# Patient Record
Sex: Female | Born: 1975 | Race: Black or African American | Hispanic: No | Marital: Single | Smoking: Current every day smoker
Health system: Southern US, Community
[De-identification: ages and names within clinical notes are randomized; demographics above are authoritative.]

## PROBLEM LIST (undated history)

## (undated) ENCOUNTER — Emergency Department (HOSPITAL_BASED_OUTPATIENT_CLINIC_OR_DEPARTMENT_OTHER): Admission: EM | Payer: 59 | Source: Home / Self Care

## (undated) DIAGNOSIS — O009 Unspecified ectopic pregnancy without intrauterine pregnancy: Secondary | ICD-10-CM

---

## 2014-02-26 ENCOUNTER — Emergency Department (HOSPITAL_BASED_OUTPATIENT_CLINIC_OR_DEPARTMENT_OTHER)
Admission: EM | Admit: 2014-02-26 | Discharge: 2014-02-26 | Disposition: A | Discharge status: Home / Self Care | Payer: Medicaid Other | Attending: Emergency Medicine | Admitting: Emergency Medicine

## 2014-02-26 ENCOUNTER — Emergency Department (HOSPITAL_BASED_OUTPATIENT_CLINIC_OR_DEPARTMENT_OTHER): Payer: Medicaid Other

## 2014-02-26 ENCOUNTER — Encounter (HOSPITAL_BASED_OUTPATIENT_CLINIC_OR_DEPARTMENT_OTHER): Payer: Self-pay | Admitting: Emergency Medicine

## 2014-02-26 DIAGNOSIS — S8992XA Unspecified injury of left lower leg, initial encounter: Secondary | ICD-10-CM | POA: Diagnosis present

## 2014-02-26 DIAGNOSIS — Y939 Activity, unspecified: Secondary | ICD-10-CM | POA: Insufficient documentation

## 2014-02-26 DIAGNOSIS — W1839XA Other fall on same level, initial encounter: Secondary | ICD-10-CM | POA: Insufficient documentation

## 2014-02-26 DIAGNOSIS — Z720 Tobacco use: Secondary | ICD-10-CM | POA: Insufficient documentation

## 2014-02-26 DIAGNOSIS — Y929 Unspecified place or not applicable: Secondary | ICD-10-CM | POA: Diagnosis not present

## 2014-02-26 DIAGNOSIS — S59902A Unspecified injury of left elbow, initial encounter: Secondary | ICD-10-CM | POA: Diagnosis not present

## 2014-02-26 DIAGNOSIS — W19XXXA Unspecified fall, initial encounter: Secondary | ICD-10-CM

## 2014-02-26 MED ORDER — OXYCODONE-ACETAMINOPHEN 5-325 MG PO TABS
2.0000 | ORAL_TABLET | Freq: Once | ORAL | Status: AC
  Administered 2014-02-26: 2 via ORAL
  Filled 2014-02-26: qty 2

## 2014-02-26 MED ORDER — IBUPROFEN 800 MG PO TABS: 800.0000 mg | ORAL_TABLET | Freq: Three times a day (TID) | ORAL | Status: DC

## 2014-02-26 MED ORDER — IBUPROFEN 800 MG PO TABS
800.0000 mg | ORAL_TABLET | Freq: Once | ORAL | Status: AC
  Administered 2014-02-26: 800 mg via ORAL
  Filled 2014-02-26: qty 1

## 2014-02-26 MED ORDER — OXYCODONE-ACETAMINOPHEN 5-325 MG PO TABS: 1.0000 | ORAL_TABLET | Freq: Four times a day (QID) | ORAL | Status: DC | PRN

## 2014-02-26 NOTE — ED Provider Notes (Signed)
CSN: [id number]     Arrival date & time 02/26/14  1820 History  This chart was scribed for Richardean Canal, MD by Roxy Cedar, ED Scribe. This patient was seen in room MH02/MH02 and the patient's care was started at 7:20 PM.   Chief Complaint  Patient presents with  . Fall  . Knee Pain   Patient is a 38 y.o. female presenting with fall and knee pain. The history is provided by the patient. No language interpreter was used.  Fall  Knee Pain Associated symptoms: no back pain and no neck pain    HPI Comments: Vanessa Carpenter is a 38 y.o. female with no chronic medical conditions, who presents to the Emergency Department complaining of injury to left knee and pain to left shoulder due to fall that occurred earlier today. Patient denies LOC or head impact.  She states that she was cleaning at home when she slipped and landed on knee and left shoulder. Unable to bear weight on left leg afterwards. Patient denies allergies to medications.  History reviewed. No pertinent past medical history. Past Surgical History  Procedure Laterality Date  . Cesarean section     No family history on file. History  Substance Use Topics  . Smoking status: Current Every Day Smoker -- 0.50 packs/day    Types: Cigarettes  . Smokeless tobacco: Not on file  . Alcohol Use: No   OB History   Grav Para Term Preterm Abortions TAB SAB Ect Mult Living                 Review of Systems  Musculoskeletal: Positive for arthralgias and myalgias. Negative for back pain and neck pain.  All other systems reviewed and are negative.  Allergies  Review of patient's allergies indicates no known allergies.  Home Medications   Prior to Admission medications   Not on File   Triage Vitals: BP 139/93  Pulse 73  Temp(Src) 98.3 F (36.8 C) (Oral)  Resp 20  Ht 5\' 6"  (1.676 m)  Wt 225 lb (102.059 kg)  BMI 36.33 kg/m2  SpO2 100%  Physical Exam  Nursing note and vitals reviewed. Constitutional: She is oriented to  person, place, and time. She appears well-developed and well-nourished. No distress.  HENT:  Head: Normocephalic and atraumatic.  Eyes: Conjunctivae and EOM are normal.  Neck: Neck supple. No tracheal deviation present.  Cardiovascular: Normal rate, regular rhythm and normal heart sounds.  Exam reveals no gallop and no friction rub.   No murmur heard. Pulmonary/Chest: Effort normal and breath sounds normal. No respiratory distress.  Abdominal: Soft. Bowel sounds are normal. There is no tenderness.  Musculoskeletal: Normal range of motion. She exhibits tenderness.  No midline spinal tenderness. Tenderness on left AC joint. Tenderness to left upper arm, close to the elbow. No pain in left hip. Tenderness over the lateral aspect of the left knee. PCL and ACL are intact. Mild tenderness over the proximal tibia. Neurovascularly intact.  Neurological: She is alert and oriented to person, place, and time.  Skin: Skin is warm and dry.  Psychiatric: She has a normal mood and affect. Her behavior is normal.   ED Course  Procedures (including critical care time)  DIAGNOSTIC STUDIES: Oxygen Saturation is 100% on RA, normal by my interpretation.    COORDINATION OF CARE: 7:24 PM- Discussed plans to order diagnostic CT of left knee and xray of left humerus, left shoulder and left knee. Will give patient medication for pain management. Pt advised of  plan for treatment and pt agrees.  Labs Review Labs Reviewed - No data to display  Imaging Review Ct Knee Left Wo Contrast  02/26/2014   CLINICAL DATA:  Left knee pain status post fall. Abnormal radiographs. Initial encounter.  EXAM: CT OF THE LEFT KNEE WITHOUT CONTRAST  TECHNIQUE: Multidetector CT imaging of the left knee was performed according to the standard protocol. Multiplanar CT image reconstructions were also generated.  COMPARISON:  Radiographs same date  FINDINGS: 8 mm ossific density along the posterolateral aspect of the medial tibial plateau  is likely a loose body. No definite acute fracture or acute donor site is demonstrated. There is no significant knee joint effusion. The joint spaces are maintained.  On review of the soft tissue windows, the anterior cruciate ligament is poorly defined. There is no edema within the intercondylar notch. The extensor mechanism is intact.  IMPRESSION: 1. No definite acute osseous findings. 2. Probable central loose body posteriorly in the joint. 3. Suspected ACL deficient knee from previous injury. There are no specific signs of recent ACL injury. Follow up non emergent MRI may be helpful for further evaluation if the patient has persistent unexplained knee pain.   Electronically Signed   By: Roxy HorsemanBill  Veazey M.D.   On: 02/26/2014 20:15   Dg Shoulder Left  02/26/2014   CLINICAL DATA:  15Thirty-eight -year-old female status post fall (not otherwise specified at the time of this report). Acute left knee and shoulder pain. Initial encounter.  EXAM: LEFT SHOULDER - 2+ VIEW  COMPARISON:  Chest radiograph 08/02/2008.  FINDINGS: Mild degenerative changes have developed at the left acromioclavicular junction. The left clavicle appears intact. No glenohumeral joint dislocation. Proximal left humerus intact. No left scapula fracture identified. Visible left ribs and lung parenchyma within normal limits.  IMPRESSION: No acute fracture or dislocation identified about the left shoulder.   Electronically Signed   By: Augusto GambleLee  Hall M.D.   On: 02/26/2014 19:15   Dg Knee Complete 4 Views Left  02/26/2014   CLINICAL DATA:  Larey SeatFell and landed on left knee, acute trauma, initial encounter.  EXAM: LEFT KNEE - COMPLETE 4+ VIEW  COMPARISON:  None.  FINDINGS: No joint effusion or fracture. Difficult to exclude a small loose body in the joint, lateral to the lateral tibial spine on the AP view.  IMPRESSION: 1. No definite acute fracture.  No joint effusion. 2. Difficult to exclude a loose body in the lateral knee joint.   Electronically Signed    By: Leanna BattlesMelinda  Blietz M.D.   On: 02/26/2014 19:12   Dg Humerus Left  02/26/2014   CLINICAL DATA:  Left humerus pain post fall today  EXAM: LEFT HUMERUS - 2+ VIEW  COMPARISON:  Left shoulder same day.  FINDINGS: Two views of left humerus submitted. No acute fracture or subluxation. No radiopaque foreign body.  IMPRESSION: Negative.   Electronically Signed   By: Natasha MeadLiviu  Pop M.D.   On: 02/26/2014 20:07     EKG Interpretation None     MDM   Final diagnoses:  Elbow injury, left, initial encounter  Knee injury, left, initial encounter    Vanessa Carpenter is a 38 y.o. female here with L knee and L shoulder injury. L shoulder and humerus xray showed no fracture. L knee xray showed possible loose body. CT showed no tibial plateau fracture. She may have ACL tear from previous injury. Knee immobilizer placed, crutches given. Will d/c home with pain meds, ortho f/u.    I personally performed  the services described in this documentation, which was scribed in my presence. The recorded information has been reviewed and is accurate.   Richardean Canalavid H Leonce Bale, MD 02/26/14 2032

## 2014-02-26 NOTE — ED Notes (Signed)
Fell today. Injury to her left knee. Pain in her left shoulder.

## 2014-02-26 NOTE — Discharge Instructions (Signed)
Take motrin 800 mg every 6 hrs for pain.   Take percocet for severe pain. You may not drive.   Use knee immobilizer and crutches.   See orthopedic doctor in a week if you still have pain.   Return to ER if you have severe pain, unable to walk.

## 2014-11-16 ENCOUNTER — Encounter (HOSPITAL_BASED_OUTPATIENT_CLINIC_OR_DEPARTMENT_OTHER): Payer: Self-pay

## 2014-11-16 ENCOUNTER — Emergency Department (HOSPITAL_BASED_OUTPATIENT_CLINIC_OR_DEPARTMENT_OTHER)
Admission: EM | Admit: 2014-11-16 | Discharge: 2014-11-16 | Discharge status: Against Medical Advice | Payer: Medicaid Other | Attending: Emergency Medicine | Admitting: Emergency Medicine

## 2014-11-16 ENCOUNTER — Emergency Department (HOSPITAL_BASED_OUTPATIENT_CLINIC_OR_DEPARTMENT_OTHER): Payer: Medicaid Other

## 2014-11-16 DIAGNOSIS — Z720 Tobacco use: Secondary | ICD-10-CM | POA: Insufficient documentation

## 2014-11-16 DIAGNOSIS — M940 Chondrocostal junction syndrome [Tietze]: Secondary | ICD-10-CM | POA: Insufficient documentation

## 2014-11-16 DIAGNOSIS — F410 Panic disorder [episodic paroxysmal anxiety] without agoraphobia: Secondary | ICD-10-CM | POA: Insufficient documentation

## 2014-11-16 MED ORDER — IBUPROFEN 200 MG PO TABS
600.0000 mg | ORAL_TABLET | Freq: Once | ORAL | Status: AC
  Administered 2014-11-16: 600 mg via ORAL
  Filled 2014-11-16 (×2): qty 1

## 2014-11-16 NOTE — ED Notes (Addendum)
Spoke with Thayer Ohmhris in radiology to d/c chest xray per [facility] from Dr Jaquita RectorMelancon (resident working with Dr Donnald GarrePfeiffer)

## 2014-11-16 NOTE — ED Notes (Signed)
Walked patient to room 3 to obtain EKG, Patients family member phone rang and patient took phone from family member and asked me to leave the room she needed a moment for a private phone call. Yelling could be heard out in the hall.

## 2014-11-16 NOTE — Discharge Instructions (Signed)
Chest Pain (Nonspecific) °It is often hard to give a specific diagnosis for the cause of chest pain. There is always a chance that your pain could be related to something serious, such as a heart attack or a blood clot in the lungs. You need to follow up with your health care provider for further evaluation. °CAUSES  °· Heartburn. °· Pneumonia or bronchitis. °· Anxiety or stress. °· Inflammation around your heart (pericarditis) or lung (pleuritis or pleurisy). °· A blood clot in the lung. °· A collapsed lung (pneumothorax). It can develop suddenly on its own (spontaneous pneumothorax) or from trauma to the chest. °· Shingles infection (herpes zoster virus). °The chest wall is composed of bones, muscles, and cartilage. Any of these can be the source of the pain. °· The bones can be bruised by injury. °· The muscles or cartilage can be strained by coughing or overwork. °· The cartilage can be affected by inflammation and become sore (costochondritis). °DIAGNOSIS  °Lab tests or other studies may be needed to find the cause of your pain. Your health care provider may have you take a test called an ambulatory electrocardiogram (ECG). An ECG records your heartbeat patterns over a 24-hour period. You may also have other tests, such as: °· Transthoracic echocardiogram (TTE). During echocardiography, sound waves are used to evaluate how blood flows through your heart. °· Transesophageal echocardiogram (TEE). °· Cardiac monitoring. This allows your health care provider to monitor your heart rate and rhythm in real time. °· Holter monitor. This is a portable device that records your heartbeat and can help diagnose heart arrhythmias. It allows your health care provider to track your heart activity for several days, if needed. °· Stress tests by exercise or by giving medicine that makes the heart beat faster. °TREATMENT  °· Treatment depends on what may be causing your chest pain. Treatment may include: °· Acid blockers for  heartburn. °· Anti-inflammatory medicine. °· Pain medicine for inflammatory conditions. °· Antibiotics if an infection is present. °· You may be advised to change lifestyle habits. This includes stopping smoking and avoiding alcohol, caffeine, and chocolate. °· You may be advised to keep your head raised (elevated) when sleeping. This reduces the chance of acid going backward from your stomach into your esophagus. °Most of the time, nonspecific chest pain will improve within 2-3 days with rest and mild pain medicine.  °HOME CARE INSTRUCTIONS  °· If antibiotics were prescribed, take them as directed. Finish them even if you start to feel better. °· For the next few days, avoid physical activities that bring on chest pain. Continue physical activities as directed. °· Do not use any tobacco products, including cigarettes, chewing tobacco, or electronic cigarettes. °· Avoid drinking alcohol. °· Only take medicine as directed by your health care provider. °· Follow your health care provider's suggestions for further testing if your chest pain does not go away. °· Keep any follow-up appointments you made. If you do not go to an appointment, you could develop lasting (chronic) problems with pain. If there is any problem keeping an appointment, call to reschedule. °SEEK MEDICAL CARE IF:  °· Your chest pain does not go away, even after treatment. °· You have a rash with blisters on your chest. °· You have a fever. °SEEK IMMEDIATE MEDICAL CARE IF:  °· You have increased chest pain or pain that spreads to your arm, neck, jaw, back, or abdomen. °· You have shortness of breath. °· You have an increasing cough, or you cough   up blood. °· You have severe back or abdominal pain. °· You feel nauseous or vomit. °· You have severe weakness. °· You faint. °· You have chills. °This is an emergency. Do not wait to see if the pain will go away. Get medical help at once. Call your local emergency services (911 in [place] Do not drive  yourself to the hospital. °MAKE SURE YOU:  °· Understand these instructions. °· Will watch your condition. °· Will get help right away if you are not doing well or get worse. °Document Released: 02/01/2005 Document Revised: 04/29/2013 Document Reviewed: 11/28/2007 °[employer]® Patient Information ©2015 [employer]. This information is not intended to replace advice given to you by your health care provider. Make sure you discuss any questions you have with your health care provider. ° °Costochondritis °Costochondritis, sometimes called Tietze syndrome, is a swelling and irritation (inflammation) of the tissue (cartilage) that connects your ribs with your breastbone (sternum). It causes pain in the chest and rib area. Costochondritis usually goes away on its own over time. It can take up to 6 weeks or longer to get better, especially if you are unable to limit your activities. °CAUSES  °Some cases of costochondritis have no known cause. Possible causes include: °· Injury (trauma). °· Exercise or activity such as lifting. °· Severe coughing. °SIGNS AND SYMPTOMS °· Pain and tenderness in the chest and rib area. °· Pain that gets worse when coughing or taking deep breaths. °· Pain that gets worse with specific movements. °DIAGNOSIS  °Your health care provider will do a physical exam and ask about your symptoms. Chest X-rays or other tests may be done to rule out other problems. °TREATMENT  °Costochondritis usually goes away on its own over time. Your health care provider may prescribe medicine to help relieve pain. °HOME CARE INSTRUCTIONS  °· Avoid exhausting physical activity. Try not to strain your ribs during normal activity. This would include any activities using chest, abdominal, and side muscles, especially if heavy weights are used. °· Apply ice to the affected area for the first 2 days after the pain begins. °¨ Put ice in a plastic bag. °¨ Place a towel between your skin and the bag. °¨ Leave the ice on for 20  minutes, 2-3 times a day. °· Only take over-the-counter or prescription medicines as directed by your health care provider. °SEEK MEDICAL CARE IF: °· You have redness or swelling at the rib joints. These are signs of infection. °· Your pain does not go away despite rest or medicine. °SEEK IMMEDIATE MEDICAL CARE IF:  °· Your pain increases or you are very uncomfortable. °· You have shortness of breath or difficulty breathing. °· You cough up blood. °· You have worse chest pains, sweating, or vomiting. °· You have a fever or persistent symptoms for more than 2-3 days. °· You have a fever and your symptoms suddenly get worse. °MAKE SURE YOU:  °· Understand these instructions. °· Will watch your condition. °· Will get help right away if you are not doing well or get worse. °Document Released: 02/01/2005 Document Revised: 02/12/2013 Document Reviewed: 11/26/2012 °ExitCare® Patient Information ©2015 [employer]. This information is not intended to replace advice given to you by your health care provider. Make sure you discuss any questions you have with your health care provider. ° °

## 2014-11-16 NOTE — ED Notes (Addendum)
MD at bedside. 

## 2014-11-16 NOTE — ED Notes (Signed)
C/o left side CP,pain worse with deep breath, nonprod cough

## 2014-11-16 NOTE — ED Notes (Signed)
Dr Josem KaufmannPfeffier at bedside

## 2014-11-16 NOTE — ED Provider Notes (Signed)
CSN: [id number]     Arrival date & time 11/16/14  1107 History   First MD Initiated Contact with Patient 11/16/14 1118     Chief Complaint  Patient presents with  . Chest Pain     (Consider location/radiation/quality/duration/timing/severity/associated sxs/prior Treatment) HPI Comments: 39 y/o F with PMH of Panic Disorder, here with 5 days of worsening left lower chest pain with cough and deep breathing. No nausea, vomiting, central chest pain, pressure, tightness, arm pain, neck pain, jaw pain, diaphoresis. She has not had fever, chills. She has not had any sick contacts. Previously diagnosed with Costochondritis 3 days ago and was given cough medicine, prednisone, and Azithromycin with little improvement. She returns for evaluation today due to lack of improvement. She does sometimes take antacids for acid reflux.   Patient is a 39 y.o. female presenting with chest pain. The history is provided by the patient.  Chest Pain Pain location:  L chest and L lateral chest Pain quality: aching and sharp   Pain quality: not crushing, no pressure and no tightness   Pain radiates to:  Does not radiate Pain radiates to the back: no   Pain severity:  Moderate Onset quality:  Gradual Duration:  5 days Timing:  Constant Progression:  Waxing and waning Chronicity:  New Context: breathing, movement and at rest   Context: no drug use, not eating, no intercourse, not lifting, not raising an arm, no stress and no trauma   Relieved by:  Nothing Worsened by:  Coughing, deep breathing, certain positions and movement Ineffective treatments:  None tried Associated symptoms: cough and shortness of breath   Associated symptoms: no abdominal pain, no anorexia, no back pain, no diaphoresis, no dizziness, no fatigue, no fever, no headache, no lower extremity edema, no nausea, no numbness, no palpitations, not vomiting and no weakness   Cough:    Cough characteristics:  Non-productive, dry and hacking   Sputum  characteristics:  Nondescript   Severity:  Moderate   Onset quality:  Gradual   Duration:  6 days   Timing:  Intermittent   Progression:  Unchanged   Chronicity:  New Risk factors: birth control, obesity and smoking   Risk factors: no aortic disease, no coronary artery disease, no diabetes mellitus, no Ehlers-Danlos syndrome, no hypertension, no immobilization, not pregnant and no prior DVT/PE     History reviewed. No pertinent past medical history. Past Surgical History  Procedure Laterality Date  . Cesarean section     No family history on file. History  Substance Use Topics  . Smoking status: Current Every Day Smoker -- 0.50 packs/day    Types: Cigarettes  . Smokeless tobacco: Not on file  . Alcohol Use: No   OB History    No data available     Review of Systems  Constitutional: Negative for fever, diaphoresis and fatigue.  HENT: Negative for congestion.   Respiratory: Positive for cough and shortness of breath.   Cardiovascular: Positive for chest pain. Negative for palpitations and leg swelling.  Gastrointestinal: Negative for nausea, vomiting, abdominal pain, diarrhea, constipation and anorexia.  Endocrine: Negative.   Genitourinary: Negative.   Musculoskeletal: Negative for myalgias, back pain, joint swelling, arthralgias, neck pain and neck stiffness.  Skin: Negative.   Allergic/Immunologic: Negative.   Neurological: Negative.  Negative for dizziness, syncope, speech difficulty, weakness, numbness and headaches.  Hematological: Negative.   Psychiatric/Behavioral: Negative.       Allergies  Review of patient's allergies indicates no known allergies.  Home Medications  Prior to Admission medications   Medication Sig Start Date End Date Taking? Authorizing Provider  UNKNOWN TO PATIENT Med for panic attacks   Yes Historical Provider, MD   BP 150/82 mmHg  Pulse 75  Temp(Src) 97.7 F (36.5 C) (Oral)  Resp 18  Ht 5\' 6"  (1.676 m)  Wt 240 lb (108.863 kg)   BMI 38.76 kg/m2  SpO2 100% Physical Exam  Constitutional: She is oriented to person, place, and time. She appears well-developed and well-nourished. No distress.  HENT:  Head: Normocephalic and atraumatic.  Eyes: Conjunctivae and EOM are normal. Pupils are equal, round, and reactive to light.  Neck: Normal range of motion. Neck supple.  Cardiovascular: Normal rate, regular rhythm, S1 normal, S2 normal, normal heart sounds, intact distal pulses and normal pulses.  PMI is not displaced.  Exam reveals no gallop and no friction rub.   No murmur heard. Pulmonary/Chest: Effort normal and breath sounds normal. No accessory muscle usage. No respiratory distress. She has no decreased breath sounds. She has no wheezes. She has no rhonchi. She has no rales. She exhibits tenderness.    Abdominal: Soft. Bowel sounds are normal. She exhibits no distension and no mass. There is no tenderness. There is no rebound.  Musculoskeletal: Normal range of motion. She exhibits tenderness. She exhibits no edema.  Neurological: She is alert and oriented to person, place, and time.  Skin: Skin is warm and dry. No rash noted. She is not diaphoretic. No erythema.  Psychiatric: Her behavior is normal.    ED Course  Procedures (including critical care time) Labs Review Labs Reviewed - No data to display  Imaging Review No results found.   EKG Interpretation   Date/Time:  Monday November 16 2014 11:28:26 EDT Ventricular Rate:  55 PR Interval:  122 QRS Duration: 74 QT Interval:  404 QTC Calculation: 386 R Axis:   67 Text Interpretation:  Sinus bradycardia Otherwise normal ECG agree  Confirmed by Donnald GarrePfeiffer, MD, Lebron ConnersMarcy [phone]([id number]) on 11/16/2014 11:29:21 AM      MDM   Final diagnoses:  Costochondritis    Pt. Is a 39 y/o F here with atypical chest pain. Reproducible TTP that is worse on inspiration / breathing. Her vital signs are stable. Consistent with costochondritis, though given the fact that she is smoking and  has been on depo shot for birth control she does have some risk of PE. Well's - 0, but would consider D-Dimer. EKG without acute changes.   Plan was to get D-Dimer to help rule out PE, however the patient's husband had a dialysis appointment, and could not wait. She decided to leave AMA. Says she will return later today. Pt. Left AMA before we were able to get D-Dimer.      Yolande Jollyaleb G [place] Sarinana, MD 11/16/14 1255  Arby BarretteMarcy Pfeiffer, MD 11/17/14 1407

## 2014-12-16 ENCOUNTER — Emergency Department (HOSPITAL_BASED_OUTPATIENT_CLINIC_OR_DEPARTMENT_OTHER)
Admission: EM | Admit: 2014-12-16 | Discharge: 2014-12-16 | Disposition: A | Discharge status: Home / Self Care | Payer: 59 | Attending: Emergency Medicine | Admitting: Emergency Medicine

## 2014-12-16 ENCOUNTER — Encounter (HOSPITAL_BASED_OUTPATIENT_CLINIC_OR_DEPARTMENT_OTHER): Payer: Self-pay

## 2014-12-16 DIAGNOSIS — Z720 Tobacco use: Secondary | ICD-10-CM | POA: Diagnosis not present

## 2014-12-16 DIAGNOSIS — N946 Dysmenorrhea, unspecified: Secondary | ICD-10-CM | POA: Diagnosis not present

## 2014-12-16 DIAGNOSIS — R102 Pelvic and perineal pain: Secondary | ICD-10-CM | POA: Diagnosis present

## 2014-12-16 DIAGNOSIS — Z3202 Encounter for pregnancy test, result negative: Secondary | ICD-10-CM | POA: Insufficient documentation

## 2014-12-16 HISTORY — DX: Unspecified ectopic pregnancy without intrauterine pregnancy: O00.90

## 2014-12-16 LAB — URINALYSIS, ROUTINE W REFLEX MICROSCOPIC
Bilirubin Urine: NEGATIVE
Glucose, UA: NEGATIVE mg/dL
KETONES UR: NEGATIVE mg/dL
NITRITE: NEGATIVE
Protein, ur: NEGATIVE mg/dL
SPECIFIC GRAVITY, URINE: 1.019 (ref 1.005–1.030)
Urobilinogen, UA: 0.2 mg/dL (ref 0.0–1.0)
pH: 6 (ref 5.0–8.0)

## 2014-12-16 LAB — URINE MICROSCOPIC-ADD ON

## 2014-12-16 LAB — WET PREP, GENITAL
Trich, Wet Prep: NONE SEEN
Yeast Wet Prep HPF POC: NONE SEEN

## 2014-12-16 LAB — PREGNANCY, URINE: Preg Test, Ur: NEGATIVE

## 2014-12-16 MED ORDER — NAPROXEN 500 MG PO TABS: 500.0000 mg | ORAL_TABLET | Freq: Two times a day (BID) | ORAL | Status: DC

## 2014-12-16 MED ORDER — KETOROLAC TROMETHAMINE 60 MG/2ML IM SOLN
60.0000 mg | Freq: Once | INTRAMUSCULAR | Status: AC
  Administered 2014-12-16: 60 mg via INTRAMUSCULAR
  Filled 2014-12-16: qty 2

## 2014-12-16 NOTE — ED Notes (Signed)
Urine spec obtained and to lab 

## 2014-12-16 NOTE — ED Provider Notes (Signed)
CSN: [id number]     Arrival date & time 12/16/14  1239 History   First MD Initiated Contact with Patient 12/16/14 1321     Chief Complaint  Patient presents with  . Pelvic Pain     (Consider location/radiation/quality/duration/timing/severity/associated sxs/prior Treatment) HPI  Vanessa Carpenter is a(n) 39 y.o. female who presents to the emergency department, chief complaint of pelvic pain. Patient states she's had multiple ectopic pregnancies. She does not expect that she is pregnant today. She did start her period today and complains of severe menstrual cramps. She states she normally has severe menstrual cramps and that this is not different from any other menstruation cycle. She denies vaginal symptoms. Dyspareunia or urinary symptoms. She does not currently have an OB/GYN. She has some nausea but denies vomiting, diarrhea or constipation. The patient took Tylenol and Motrin without relief of her symptoms.  3  Past Medical History  Diagnosis Date  . Ectopic pregnancy    Past Surgical History  Procedure Laterality Date  . Cesarean section     No family history on file. Social History  Substance Use Topics  . Smoking status: Current Every Day Smoker -- 0.50 packs/day    Types: Cigarettes  . Smokeless tobacco: None  . Alcohol Use: No   OB History    No data available     Review of Systems Ten systems reviewed and are negative for acute change, except as noted in the HPI.     Allergies  Review of patient's allergies indicates no known allergies.  Home Medications   Prior to Admission medications   Medication Sig Start Date End Date Taking? Authorizing Provider  UNKNOWN TO PATIENT Med for panic attacks    Historical Provider, MD   BP 129/73 mmHg  Pulse 65  Temp(Src) 97.8 F (36.6 C) (Oral)  Resp 18  Ht  (1.676 m)  Wt 230 lb (104.327 kg)  BMI 37.14 kg/m2  SpO2 97%  LMP 12/15/2014 Physical Exam  Constitutional: She is oriented to person, place, and time. She  appears well-developed and well-nourished. No distress.  HENT:  Head: Normocephalic and atraumatic.  Eyes: Conjunctivae are normal. No scleral icterus.  Neck: Normal range of motion.  Cardiovascular: Normal rate, regular rhythm and normal heart sounds.  Exam reveals no gallop and no friction rub.   No murmur heard. Pulmonary/Chest: Effort normal and breath sounds normal. No respiratory distress.  Abdominal: Soft. Bowel sounds are normal. She exhibits no distension and no mass. There is no tenderness. There is no guarding.  Genitourinary:  Pelvic exam: normal external genitalia, vulva, vagina, cervix, uterus and adnexa. Bleeding from os associated with menstruation  Neurological: She is alert and oriented to person, place, and time.  Skin: Skin is warm and dry. She is not diaphoretic.  Nursing note and vitals reviewed.   ED Course  Procedures (including critical care time) Labs Review Labs Reviewed  URINALYSIS, ROUTINE W REFLEX MICROSCOPIC (NOT AT [facility]) - Abnormal; Notable for the following:    Hgb urine dipstick LARGE (*)    Leukocytes, UA TRACE (*)    All other components within normal limits  WET PREP, GENITAL  PREGNANCY, URINE  URINE MICROSCOPIC-ADD ON  HIV ANTIBODY (ROUTINE TESTING)  RPR  GC/CHLAMYDIA PROBE AMP ([place]) NOT AT [facility]    Imaging Review No results found.   EKG Interpretation None      MDM   Final diagnoses:  Dysmenorrhea    3:19 PM Patient with apparent dysmenorrhea. Benign pelvic examination.  Toradol here in the ED. D/c with naproxen.    Arthor Captain, PA-C 12/16/14 1544  Zadie Rhine, MD 12/16/14 [phone]

## 2014-12-16 NOTE — Discharge Instructions (Signed)

## 2014-12-16 NOTE — ED Notes (Signed)
States having pain period at this time. Onset yesterday. States period flow having clots, and very heavy than usual.

## 2014-12-16 NOTE — ED Notes (Signed)
C/o pelvic pain started yesterday-states "i'v had 5 ectopic pregnancies and i also have to come and get a shot when i start my period"

## 2014-12-17 LAB — GC/CHLAMYDIA PROBE AMP (~~LOC~~) NOT AT ARMC
Chlamydia: NEGATIVE
Neisseria Gonorrhea: NEGATIVE

## 2014-12-17 LAB — HIV ANTIBODY (ROUTINE TESTING W REFLEX): HIV SCREEN 4TH GENERATION: NONREACTIVE

## 2014-12-17 LAB — RPR: RPR Ser Ql: NONREACTIVE

## 2016-01-20 DIAGNOSIS — J4540 Moderate persistent asthma, uncomplicated: Secondary | ICD-10-CM | POA: Insufficient documentation

## 2016-01-20 DIAGNOSIS — Z720 Tobacco use: Secondary | ICD-10-CM | POA: Insufficient documentation

## 2016-01-20 DIAGNOSIS — Z79891 Long term (current) use of opiate analgesic: Secondary | ICD-10-CM | POA: Insufficient documentation

## 2016-01-20 DIAGNOSIS — G43009 Migraine without aura, not intractable, without status migrainosus: Secondary | ICD-10-CM | POA: Insufficient documentation

## 2016-04-20 DIAGNOSIS — Z3042 Encounter for surveillance of injectable contraceptive: Secondary | ICD-10-CM | POA: Insufficient documentation

## 2016-11-02 ENCOUNTER — Emergency Department (HOSPITAL_BASED_OUTPATIENT_CLINIC_OR_DEPARTMENT_OTHER)
Admission: EM | Admit: 2016-11-02 | Discharge: 2016-11-02 | Disposition: A | Discharge status: Home / Self Care | Payer: Self-pay | Attending: Emergency Medicine | Admitting: Emergency Medicine

## 2016-11-02 ENCOUNTER — Encounter (HOSPITAL_BASED_OUTPATIENT_CLINIC_OR_DEPARTMENT_OTHER): Payer: Self-pay | Admitting: *Deleted

## 2016-11-02 ENCOUNTER — Emergency Department (HOSPITAL_BASED_OUTPATIENT_CLINIC_OR_DEPARTMENT_OTHER): Payer: Self-pay

## 2016-11-02 DIAGNOSIS — F17210 Nicotine dependence, cigarettes, uncomplicated: Secondary | ICD-10-CM | POA: Insufficient documentation

## 2016-11-02 DIAGNOSIS — M19011 Primary osteoarthritis, right shoulder: Secondary | ICD-10-CM | POA: Insufficient documentation

## 2016-11-02 MED ORDER — NAPROXEN 500 MG PO TABS: 500.0000 mg | ORAL_TABLET | Freq: Two times a day (BID) | ORAL | 0 refills | Status: DC

## 2016-11-02 NOTE — Discharge Instructions (Signed)
See the sports medicine doctor soon. Ice the shoulder 3 or 4 times a day for 5 min

## 2016-11-02 NOTE — ED Notes (Signed)
Rounded on the patient. The patient no longer crying but states that her Right shoulder still hurts a lot - Ptient given 2 heat packs and instructed not put them directly on skin. Patient sitting texting on phone in room

## 2016-11-02 NOTE — ED Triage Notes (Addendum)
Woke at 2am with pain in her right shoulder. Hx of arthritis. States she took Naprosyn with no relief. 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 She denies injury.

## 2016-11-02 NOTE — ED Notes (Signed)
Patient called RN to the room, crying in pain. Patient MAEW - patient is holding her right arm at times

## 2016-11-02 NOTE — ED Notes (Signed)
ED Provider at bedside. To discuss with the patient about medications and treatment  - Patient aware of the Reason for MD delay

## 2016-11-03 NOTE — ED Provider Notes (Signed)
[facility]-EMERGENCY DEPT Provider Note   CSN: [id number] Arrival date & time: 11/02/16  1103     History   Chief Complaint Chief Complaint  Patient presents with  . Arm Pain    HPI Vanessa Carpenter is a 41 y.o. female.  HPI Pt comes in with cc of arm pain. Pt has hx of shoulder OA. She reports that her R shoulder has been extremely painful since 2 am last night when she tried to go sleep. She denies any trauma.   Past Medical History:  Diagnosis Date  . Ectopic pregnancy     There are no active problems to display for this patient.   Past Surgical History:  Procedure Laterality Date  . CESAREAN SECTION      OB History    No data available       Home Medications    Prior to Admission medications   Medication Sig Start Date End Date Taking? Authorizing Provider  naproxen (NAPROSYN) 500 MG tablet Take 1 tablet (500 mg total) by mouth 2 (two) times daily with a meal. 11/02/16   Derwood KaplanNanavati, Nyasia Baxley, MD    Family History No family history on file.  Social History Social History  Substance Use Topics  . Smoking status: Current Every Day Smoker    Packs/day: 0.50    Types: Cigarettes  . Smokeless tobacco: Never Used  . Alcohol use No     Allergies   Patient has no known allergies.   Review of Systems Review of Systems  Constitutional: Positive for activity change.  Musculoskeletal: Positive for arthralgias.     Physical Exam Updated Vital Signs BP (!) 146/78 (BP Location: Left Arm)   Pulse (!) 58   Temp 98.3 F (36.8 C) (Oral)   Resp 18   Ht 5\' 6"  (1.676 m)   Wt 91.2 kg (201 lb)   SpO2 100%   BMI 32.44 kg/m   Physical Exam  Constitutional: She is oriented to person, place, and time. She appears well-developed.  HENT:  Head: Normocephalic and atraumatic.  Eyes: EOM are normal.  Neck: Normal range of motion. Neck supple.  Cardiovascular: Normal rate.   Pulmonary/Chest: Effort normal.  Abdominal: Bowel sounds are normal.  Musculoskeletal: She  exhibits edema and tenderness.  No callor or rubor. There is mild edema. Passive ROM is tender with abduction.  Neurological: She is alert and oriented to person, place, and time.  Skin: Skin is warm and dry. No rash noted. No erythema.  Nursing note and vitals reviewed.    ED Treatments / Results  Labs (all labs ordered are listed, but only abnormal results are displayed) Labs Reviewed - No data to display  EKG  EKG Interpretation None       Radiology Dg Shoulder Right  Result Date: 11/02/2016 CLINICAL DATA:  Onset of right shoulder pain today. No known injury. History of bilateral shoulder arthritis. EXAM: RIGHT SHOULDER - 2+ VIEW COMPARISON:  Chest x-ray of November 14, 2014 which included portions of the right shoulder. FINDINGS: The bones are subjectively adequately mineralized. There is mild narrowing of the glenohumeral joint space and of the [facility] joint space. The subacromial subdeltoid space appears normal. There is no acute or healing fracture and no dislocation. IMPRESSION: There is no acute bony abnormality of the right shoulder. There are mild degenerative changes of the [facility] joint and glenohumeral joints. Electronically Signed   By: David  SwazilandJordan M.D.   On: 11/02/2016 12:25    Procedures Procedures (including critical care time)  Medications Ordered in ED Medications - No data to display   Initial Impression / Assessment and Plan / ED Course  I have reviewed the triage vital signs and the nursing notes.  Pertinent labs & imaging results that were available during my care of the patient were reviewed by me and considered in my medical decision making (see chart for details).     Pt comes in with cc of shoulder pain. Seems like she has OA worsening. F/U provided. We dont think there is septic joint based on hx and exam.  Final Clinical Impressions(s) / ED Diagnoses   Final diagnoses:  Localized osteoarthritis of right shoulder    New Prescriptions Discharge  Medication List as of 11/02/2016  1:26 PM       Derwood Kaplan, MD 11/03/16 1731

## 2016-11-06 ENCOUNTER — Encounter: Payer: Self-pay | Admitting: Family Medicine

## 2016-11-06 ENCOUNTER — Ambulatory Visit (INDEPENDENT_AMBULATORY_CARE_PROVIDER_SITE_OTHER): Payer: BLUE CROSS/BLUE SHIELD | Admitting: Family Medicine

## 2016-11-06 VITALS — BP 156/83 | HR 132 | Ht 66.0 in | Wt 201.0 lb

## 2016-11-06 DIAGNOSIS — M25511 Pain in right shoulder: Secondary | ICD-10-CM

## 2016-11-06 MED ORDER — DICLOFENAC SODIUM 75 MG PO TBEC: 75.0000 mg | DELAYED_RELEASE_TABLET | Freq: Two times a day (BID) | ORAL | 1 refills | Status: AC

## 2016-11-06 NOTE — Patient Instructions (Signed)
You have a rotator cuff strain, impingement. Try to avoid painful activities (overhead activities, lifting with extended arm) as much as possible. Diclofenac 75mg  twice a day with food for pain and inflammation. Can take tylenol in addition to this. Subacromial injection may be beneficial to help with pain and to decrease inflammation - you were given this today. Consider physical therapy with transition to home exercise program. Do home exercise program with theraband and scapular stabilization exercises daily - these are very important for long term relief even if an injection was given.  3 sets of 10 but wait a week before starting these with yellow theraband. If not improving at follow-up we will consider further imaging, physical therapy, and/or nitro patches. Follow up with me in 1 month but call me sooner if not improving as expected.

## 2016-11-07 DIAGNOSIS — F411 Generalized anxiety disorder: Secondary | ICD-10-CM | POA: Insufficient documentation

## 2016-11-07 DIAGNOSIS — F410 Panic disorder [episodic paroxysmal anxiety] without agoraphobia: Secondary | ICD-10-CM | POA: Insufficient documentation

## 2016-11-07 DIAGNOSIS — M25511 Pain in right shoulder: Secondary | ICD-10-CM | POA: Insufficient documentation

## 2016-11-07 MED ORDER — METHYLPREDNISOLONE ACETATE 40 MG/ML IJ SUSP
40.0000 mg | Freq: Once | INTRAMUSCULAR | Status: AC
  Administered 2016-11-07: 40 mg via INTRA_ARTICULAR

## 2016-11-07 NOTE — Progress Notes (Signed)
PCP: Claudean SeveranceBradley, Betty, MD  Subjective:   HPI: Patient is a 41 y.o. female here for right shoulder pain.  Patient reports on 6/27 she started to get pain in right shoulder, deep and sharp. 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 No acute injury with this but felt pain afterwards. Some swelling.is right handed. Pain level is 4/10 but up to 9/10 and sharp. Difficulty lifting arm overhead. Taking ibuprofen. No skin changes, numbness. Prior issues with left shoulder, not right shoulder.  Past Medical History:  Diagnosis Date  . Ectopic pregnancy     No current outpatient prescriptions on file prior to visit.   No current facility-administered medications on file prior to visit.     Past Surgical History:  Procedure Laterality Date  . CESAREAN SECTION      Allergies  Allergen Reactions  . Sulfamethoxazole-Trimethoprim Swelling    Social History   Social History  . Marital status: Single    Spouse name: N/A  . Number of children: N/A  . Years of education: N/A   Occupational History  . Not on file.   Social History Main Topics  . Smoking status: Current Every Day Smoker    Packs/day: 0.50    Types: Cigarettes  . Smokeless tobacco: Never Used  . Alcohol use No  . Drug use: No  . Sexual activity: Yes    Birth control/ protection: Injection   Other Topics Concern  . Not on file   Social History Narrative  . No narrative on file    No family history on file.  BP (!) 156/83   Pulse (!) 132   Ht 5\' 6"  (1.676 m)   Wt 201 lb (91.2 kg)   BMI 32.44 kg/m   Review of Systems: See HPI above.     Objective:  Physical Exam:  Gen: NAD, comfortable in exam room  Right shoulder: No swelling, ecchymoses.  No gross deformity. No TTP. Full IR and ER but flexion limited to 90 degrees, abduction to 90 degrees, painful. Positive Hawkins, Neers. Negative Yergasons. Strength 5/5 with empty can  and resisted internal/external rotation.  Pain empty can more than ER. Negative apprehension. NV intact distally.  Left shoulder: FROM without pain.   Assessment & Plan:  1. Right shoulder pain - consistent with rotator cuff strain, impingement.  Start with diclofenac, subacromial injection given today as well.  Shown home exercises to do daily starting in a few days.  Consider further imaging, physical therapy, nitro patches if not improving.  F/u in 1 month.  After informed written consent, patient was seated on exam table. Right shoulder was prepped with alcohol swab and utilizing posterior approach, patient's right subacromial space was injected with 3:1 bupivicaine: depomedrol. Patient tolerated the procedure well without immediate complications.

## 2016-11-07 NOTE — Assessment & Plan Note (Signed)
consistent with rotator cuff strain, impingement.  Start with diclofenac, subacromial injection given today as well.  Shown home exercises to do daily starting in a few days.  Consider further imaging, physical therapy, nitro patches if not improving.  F/u in 1 month.  After informed written consent, patient was seated on exam table. Right shoulder was prepped with alcohol swab and utilizing posterior approach, patient's right subacromial space was injected with 3:1 bupivicaine: depomedrol. Patient tolerated the procedure well without immediate complications.

## 2016-12-07 ENCOUNTER — Ambulatory Visit: Payer: BLUE CROSS/BLUE SHIELD | Admitting: Family Medicine

## 2016-12-08 ENCOUNTER — Ambulatory Visit: Payer: BLUE CROSS/BLUE SHIELD | Admitting: Family Medicine

## 2016-12-11 ENCOUNTER — Ambulatory Visit (INDEPENDENT_AMBULATORY_CARE_PROVIDER_SITE_OTHER): Payer: BLUE CROSS/BLUE SHIELD | Admitting: Family Medicine

## 2016-12-11 ENCOUNTER — Encounter: Payer: Self-pay | Admitting: Family Medicine

## 2016-12-11 DIAGNOSIS — M25511 Pain in right shoulder: Secondary | ICD-10-CM | POA: Diagnosis not present

## 2016-12-11 MED ORDER — NITROGLYCERIN 0.2 MG/HR TD PT24: MEDICATED_PATCH | TRANSDERMAL | 1 refills | Status: AC

## 2016-12-11 NOTE — Patient Instructions (Signed)
You have a rotator cuff strain, impingement. Try to avoid painful activities (overhead activities, lifting with extended arm) as much as possible. Diclofenac 75mg  twice a day with food for pain and inflammation only if needed now. Can take tylenol in addition to this. I wouldn't recommend repeating the injection Start physical therapy with transition to home exercise program. Do home exercise program with theraband and scapular stabilization exercises daily - these are very important for long term relief even if an injection was given.  3 sets of 10. Start nitro patches - 1/4th patch to affected shoulder, change daily. If not improving at follow-up we will consider further imaging. Follow up with me in 6 weeks.

## 2016-12-12 NOTE — Assessment & Plan Note (Signed)
consistent with rotator cuff strain, impingement.  S/p subacromial injection, diclofenac, home exercises.  Continue diclofenac only as needed now.  Start physical therapy and nitro patches (discussed risks of skin irritation, headaches).  F/u in 6 weeks.  Consider further imaging if not improving.

## 2016-12-12 NOTE — Progress Notes (Signed)
PCP: Claudean SeveranceBradley, Betty, MD  Subjective:   HPI: Patient is a 41 y.o. female here for right shoulder pain.  7/2: Patient reports on 6/27 she started to get pain in right shoulder, deep and sharp. 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 No acute injury with this but felt pain afterwards. Some swelling.is right handed. Pain level is 4/10 but up to 9/10 and sharp. Difficulty lifting arm overhead. Taking ibuprofen. No skin changes, numbness. Prior issues with left shoulder, not right shoulder.  8/6: Patient reports she feels better. Pain level currently 0/10 but does get a burning feeling in shoulder especially reaching behind, trying to fasten bra. Doing home exercises and taking diclofenac. No skin changes, numbness.  Past Medical History:  Diagnosis Date  . Ectopic pregnancy     Current Outpatient Prescriptions on File Prior to Visit  Medication Sig Dispense Refill  . beclomethasone (QVAR) 80 MCG/ACT inhaler Inhale into the lungs.    . diclofenac (VOLTAREN) 75 MG EC tablet Take 1 tablet (75 mg total) by mouth 2 (two) times daily. 60 tablet 1  . medroxyPROGESTERone (DEPO-PROVERA) 150 MG/ML injection Inject into the muscle.    Marland Kitchen. omeprazole (PRILOSEC) 20 MG capsule Take by mouth.    . Oxycodone HCl 10 MG TABS TAKE 1 TABLET BY MOUTH EVERY 6 HOURS IF NEEDED FOR PAIN, up to three times a day.    Marland Kitchen. PARoxetine (PAXIL) 30 MG tablet Take by mouth.     No current facility-administered medications on file prior to visit.     Past Surgical History:  Procedure Laterality Date  . CESAREAN SECTION      Allergies  Allergen Reactions  . Sulfamethoxazole-Trimethoprim Swelling    Social History   Social History  . Marital status: Single    Spouse name: N/A  . Number of children: N/A  . Years of education: N/A   Occupational History  . Not on file.   Social History Main Topics  . Smoking status: Current Every  Day Smoker    Packs/day: 0.50    Types: Cigarettes  . Smokeless tobacco: Never Used  . Alcohol use No  . Drug use: No  . Sexual activity: Yes    Birth control/ protection: Injection   Other Topics Concern  . Not on file   Social History Narrative  . No narrative on file    No family history on file.  BP 121/89   Pulse 84   Ht 5\' 6"  (1.676 m)   Wt 200 lb (90.7 kg)   BMI 32.28 kg/m   Review of Systems: See HPI above.     Objective:  Physical Exam:  Gen: NAD, comfortable in exam room  Right shoulder: No swelling, ecchymoses.  No gross deformity. No TTP. Full IR and ER but flexion limited to 120 degrees, abduction to 120 degrees, painful. Positive Hawkins, Neers. Negative Yergasons. Strength 5/5 with empty can and resisted internal/external rotation.  Pain empty can > ER. Negative apprehension. NV intact distally.  Left shoulder: FROM without pain.   Assessment & Plan:  1. Right shoulder pain - consistent with rotator cuff strain, impingement.  S/p subacromial injection, diclofenac, home exercises.  Continue diclofenac only as needed now.  Start physical therapy and nitro patches (discussed risks of skin irritation, headaches).  F/u in 6 weeks.  Consider further imaging if not improving.

## 2016-12-28 ENCOUNTER — Telehealth: Payer: Self-pay | Admitting: Family Medicine

## 2016-12-28 NOTE — Telephone Encounter (Signed)
Ok to change PT locations.  I would have her continue same restrictions until I see her back for follow up - ok to copy prior letter I gave her and fax.

## 2016-12-28 NOTE — Telephone Encounter (Signed)
Patient has moved to [place] and would like to switch her physical therapy over to [facility] which is closer in location. 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 Patient is not sure if she was instructed to be out of work for 6 weeks until follow up or if she was cleared to go back.

## 2017-01-04 NOTE — Telephone Encounter (Signed)
Referral [facility] for [facility].

## 2017-01-22 ENCOUNTER — Ambulatory Visit: Payer: BLUE CROSS/BLUE SHIELD | Admitting: Family Medicine

## 2017-01-23 ENCOUNTER — Ambulatory Visit: Payer: BLUE CROSS/BLUE SHIELD | Admitting: Family Medicine

## 2017-01-29 ENCOUNTER — Ambulatory Visit (INDEPENDENT_AMBULATORY_CARE_PROVIDER_SITE_OTHER): Payer: BLUE CROSS/BLUE SHIELD | Admitting: Family Medicine

## 2017-01-29 ENCOUNTER — Encounter: Payer: Self-pay | Admitting: Family Medicine

## 2017-01-29 DIAGNOSIS — M25511 Pain in right shoulder: Secondary | ICD-10-CM

## 2017-01-29 NOTE — Patient Instructions (Signed)
Your rotator cuff strain has improved. Your pain is due to impingement, biceps tendinitis. Try to avoid painful activities (overhead activities, lifting with extended arm) as much as possible. Continue the naproxen as you have been (we had you on diclofenac - do NOT take the diclofenac any more since you're taking the naproxen). Can take tylenol in addition to this. Stop physical therapy and focus only on home exercises to regain your motion and strength. Do home exercise program with theraband and scapular stabilization exercises daily - these are very important for long term relief even if an injection was given.  3 sets of 10. If not improving at follow-up we will consider further imaging (MRI). Follow up with me in 6 weeks.

## 2017-01-30 NOTE — Assessment & Plan Note (Signed)
strain has improved with no pain on rotator cuff testing.  Pain and impingement testing worse though and has tenderness of biceps tendon.  Worsening pain with PT so will stop this for now and focus on home exercises.  Continue her naproxen, oxycodone, robaxin.  F/u in 6 weeks.  Consider MRI if not improving.

## 2017-01-30 NOTE — Progress Notes (Signed)
PCP: Claudean Severance, MD  Subjective:   HPI: Patient is a 40 y.o. female here for right shoulder pain.  7/2: Patient reports on 6/27 she started to get pain in right shoulder, deep and sharp. 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 No acute injury with this but felt pain afterwards. Some swelling.is right handed. Pain level is 4/10 but up to 9/10 and sharp. Difficulty lifting arm overhead. Taking ibuprofen. No skin changes, numbness. Prior issues with left shoulder, not right shoulder.  8/6: Patient reports she feels better. Pain level currently 0/10 but does get a burning feeling in shoulder especially reaching behind, trying to fasten bra. Doing home exercises and taking diclofenac. No skin changes, numbness.  9/24: Patient reports she feels therapy is making her pain worse. Pain bothers with reaching overhead and behind back. Pain up to 4/10 and can be sharp. Nitro did not help - used some of her husband's nitro. She takes oxycodone, naproxen, robaxin. Sometimes pain wakes her up at night. Doing home exercises. No skin changes, numbness.  Past Medical History:  Diagnosis Date  . Ectopic pregnancy     Current Outpatient Prescriptions on File Prior to Visit  Medication Sig Dispense Refill  . beclomethasone (QVAR) 80 MCG/ACT inhaler Inhale into the lungs.    . diclofenac (VOLTAREN) 75 MG EC tablet Take 1 tablet (75 mg total) by mouth 2 (two) times daily. 60 tablet 1  . medroxyPROGESTERone (DEPO-PROVERA) 150 MG/ML injection Inject into the muscle.    . nitroGLYCERIN (NITRODUR - DOSED IN MG/24 HR) 0.2 mg/hr patch Apply 1/4th patch to affected shoulder, change daily 30 patch 1  . omeprazole (PRILOSEC) 20 MG capsule Take by mouth.    . Oxycodone HCl 10 MG TABS TAKE 1 TABLET BY MOUTH EVERY 6 HOURS IF NEEDED FOR PAIN, up to three times a day.    Marland Kitchen PARoxetine (PAXIL) 30 MG tablet Take by mouth.     No  current facility-administered medications on file prior to visit.     Past Surgical History:  Procedure Laterality Date  . CESAREAN SECTION      Allergies  Allergen Reactions  . Sulfamethoxazole-Trimethoprim Swelling    Social History   Social History  . Marital status: Single    Spouse name: N/A  . Number of children: N/A  . Years of education: N/A   Occupational History  . Not on file.   Social History Main Topics  . Smoking status: Current Every Day Smoker    Packs/day: 0.50    Types: Cigarettes  . Smokeless tobacco: Never Used  . Alcohol use No  . Drug use: No  . Sexual activity: Yes    Birth control/ protection: Injection   Other Topics Concern  . Not on file   Social History Narrative  . No narrative on file    No family history on file.  BP (!) 151/76   Pulse 69   Ht  (1.676 m)   Wt 198 lb (89.8 kg)   BMI 31.96 kg/m   Review of Systems: See HPI above.     Objective:  Physical Exam:  Gen: NAD, comfortable in exam room  Right shoulder: No swelling, ecchymoses.  No gross deformity. TTP anteriorly over biceps tendon.  No other tenderness. FROM with painful arc. Positive Hawkins, Neers. Negative Yergasons and speeds. Strength 5/5 with empty can and resisted internal/external rotation. Negative apprehension. NV intact distally.  Left shoulder: FROM without pain.   Assessment & Plan:  1. Right shoulder pain - strain has improved with no pain on rotator cuff testing.  Pain and impingement testing worse though and has tenderness of biceps tendon.  Worsening pain with PT so will stop this for now and focus on home exercises.  Continue her naproxen, oxycodone, robaxin.  F/u in 6 weeks.  Consider MRI if not improving.

## 2017-03-12 ENCOUNTER — Ambulatory Visit: Payer: BLUE CROSS/BLUE SHIELD | Admitting: Family Medicine

## 2017-03-12 ENCOUNTER — Encounter: Payer: Self-pay | Admitting: Family Medicine

## 2017-03-12 DIAGNOSIS — M25511 Pain in right shoulder: Secondary | ICD-10-CM | POA: Diagnosis not present

## 2017-03-12 NOTE — Patient Instructions (Signed)
Unfortunately you haven't improved with conservative treatment for your shoulder. We will go ahead with an MRI to further assess for possible rotator cuff tear - we will call you with results and next steps.

## 2017-03-13 ENCOUNTER — Encounter: Payer: Self-pay | Admitting: Family Medicine

## 2017-03-13 NOTE — Progress Notes (Addendum)
PCP: Claudean SeveranceBradley, Betty, MD  Subjective:   HPI: Patient is a 41 y.o. female here for right shoulder pain.  7/2: Patient reports on 6/27 she started to get pain in right shoulder, deep and sharp. 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 No acute injury with this but felt pain afterwards. Some swelling.is right handed. Pain level is 4/10 but up to 9/10 and sharp. Difficulty lifting arm overhead. Taking ibuprofen. No skin changes, numbness. Prior issues with left shoulder, not right shoulder.  8/6: Patient reports she feels better. Pain level currently 0/10 but does get a burning feeling in shoulder especially reaching behind, trying to fasten bra. Doing home exercises and taking diclofenac. No skin changes, numbness.  9/24: Patient reports she feels therapy is making her pain worse. Pain bothers with reaching overhead and behind back. Pain up to 4/10 and can be sharp. Nitro did not help - used some of her husband's nitro. She takes oxycodone, naproxen, robaxin. Sometimes pain wakes her up at night. Doing home exercises. No skin changes, numbness.  11/5: Patient returns unfortunately with continued pain lateral right shoulder. Pain level 6/10 and sharp. Waking her up at night. Not improved by anything at this point. Stopped physical therapy but still doing home exercises. No skin changes, numbness.  Past Medical History:  Diagnosis Date  . Ectopic pregnancy     Current Outpatient Medications on File Prior to Visit  Medication Sig Dispense Refill  . beclomethasone (QVAR) 80 MCG/ACT inhaler Inhale into the lungs.    . diclofenac (VOLTAREN) 75 MG EC tablet Take 1 tablet (75 mg total) by mouth 2 (two) times daily. 60 tablet 1  . medroxyPROGESTERone (DEPO-PROVERA) 150 MG/ML injection Inject into the muscle.    . methocarbamol (ROBAXIN) 750 MG tablet   1  . nitroGLYCERIN (NITRODUR - DOSED IN MG/24 HR) 0.2  mg/hr patch Apply 1/4th patch to affected shoulder, change daily 30 patch 1  . omeprazole (PRILOSEC) 20 MG capsule Take by mouth.    . Oxycodone HCl 10 MG TABS TAKE 1 TABLET BY MOUTH EVERY 6 HOURS IF NEEDED FOR PAIN, up to three times a day.    Marland Kitchen. PARoxetine (PAXIL) 30 MG tablet Take by mouth.    Marland Kitchen. PROAIR HFA 108 (90 Base) MCG/ACT inhaler   2   No current facility-administered medications on file prior to visit.     Past Surgical History:  Procedure Laterality Date  . CESAREAN SECTION      Allergies  Allergen Reactions  . Sulfamethoxazole-Trimethoprim Swelling    Social History   Socioeconomic History  . Marital status: Single    Spouse name: Not on file  . Number of children: Not on file  . Years of education: Not on file  . Highest education level: Not on file  Social Needs  . Financial resource strain: Not on file  . Food insecurity - worry: Not on file  . Food insecurity - inability: Not on file  . Transportation needs - medical: Not on file  . Transportation needs - non-medical: Not on file  Occupational History  . Not on file  Tobacco Use  . Smoking status: Current Every Day Smoker    Packs/day: 0.50    Types: Cigarettes  . Smokeless tobacco: Never Used  Substance and Sexual Activity  . Alcohol use: No  . Drug use: No  . Sexual activity: Yes    Birth control/protection: Injection  Other Topics Concern  . Not on file  Social History Narrative  . Not on file    History reviewed. No pertinent family history.  BP 117/81   Pulse 76   Ht 5\' 6"  (1.676 m)   Wt 180 lb (81.6 kg)   BMI 29.05 kg/m   Review of Systems: See HPI above.     Objective:  Physical Exam:  Gen: NAD, comfortable in exam room.  Right shoulder: No swelling, ecchymoses.  No gross deformity. TTP anteriorly over shoulder joint.   FROM with painful arc. Positive Hawkins, Neers. Negative Yergasons. Strength 5/5 with empty can and resisted internal/external rotation.  Pain empty can >  ER. Negative apprehension. NV intact distally.  Left shoulder: No swelling, ecchymoses.  No gross deformity. No TTP. FROM. Strength 5/5 with empty can and resisted internal/external rotation. NV intact distally.   Assessment & Plan:  1. Right shoulder pain - unfortunately not improving with conservative treatment including physical therapy, home exercises, nitro patches, naproxen, oxycodone, robaxin, subacromial injection.  Will go ahead with MRI to further assess for possible rotator cuff tear.  Addendum:  MRI reviewed and discussed with patient.  Only with some fraying of supraspinatus tendon but no tearing.  No other abnormalities.  Consistent with impingement not responding to conservative treatment.  Will refer to ortho to discuss arthroscopy, acromioplasty, DCE.

## 2017-03-13 NOTE — Assessment & Plan Note (Signed)
unfortunately not improving with conservative treatment including physical therapy, home exercises, nitro patches, naproxen, oxycodone, robaxin, subacromial injection.  Will go ahead with MRI to further assess for possible rotator cuff tear.

## 2017-03-14 NOTE — Addendum Note (Signed)
Addended by: Kathi SimpersWISE, Krishna Dancel F on: 03/14/2017 09:10 AM   Modules accepted: Orders

## 2017-03-26 ENCOUNTER — Telehealth: Payer: Self-pay | Admitting: Family Medicine

## 2017-03-26 MED ORDER — DIAZEPAM 5 MG PO TABS: ORAL_TABLET | ORAL | 0 refills | Status: AC

## 2017-03-26 NOTE — Telephone Encounter (Signed)
Patient had MRI scheduled today but was unable to have test performed due to being closed in. They rescheduled her in [place] which has an open MRI machine and she requested medication to help her claustrophobia   Rescheduled for Wednesday 11/21 at 7:15am

## 2017-03-26 NOTE — Telephone Encounter (Signed)
Valium sent to her pharmacy electronically.  Thanks!

## 2017-04-03 ENCOUNTER — Encounter: Payer: Self-pay | Admitting: Family Medicine

## 2017-04-06 NOTE — Addendum Note (Signed)
Addended by: Kathi SimpersWISE, Denia Mcvicar F on: 04/06/2017 10:35 AM   Modules accepted: Orders

## 2017-04-23 ENCOUNTER — Telehealth: Payer: Self-pay | Admitting: Family Medicine

## 2017-04-23 NOTE — Telephone Encounter (Signed)
There is a clear notation in my last office note what we were referring her there for.  Can we get a copy of the surgeon's office note?  I don't have access to it in the chart.

## 2017-04-23 NOTE — Telephone Encounter (Signed)
The surgeon that she was sent to (at [facility]) did not know why the patient needed surgery for her shoulder.  So now patient would like to speak to you regarding this.  Her workers comp attorney needs to know if her future office visits are necessary. (she is not sure if she is released from your care yet)

## 2017-04-25 NOTE — Telephone Encounter (Signed)
Patient requesting to know if she still has restrictions regarding work.   I have left a message with the surgeons's office to return call to inform them the reason for surgery is stated in the notes that were faxed over as well as to request office notes from them

## 2017-04-25 NOTE — Telephone Encounter (Signed)
I would recommend continuing her restrictions until further notice, possible surgical intervention.  Will review surgeon's notes when they arrive.

## 2017-04-25 NOTE — Telephone Encounter (Signed)
Received notes from [facility].  Surgeon there recommended conservative treatment (which she has done extensively and has had problems for 6 months).  I would recommend she see a different surgeon, preferably [place] in [place] who can talk to her about arthroscopy, DCE, acromioplasty given her lack of improvement with conservative treatment.

## 2017-04-25 NOTE — Telephone Encounter (Signed)
Left message for patient to return call.

## 2017-04-27 ENCOUNTER — Telehealth: Payer: Self-pay | Admitting: *Deleted

## 2017-04-27 NOTE — Telephone Encounter (Signed)
Spoke with patient and informed her to continue with current restrictions until further notice.  She was informed that the surgeon in [place] recommended conservative treatment which she has already done.  Patient is ok with finding a Careers advisersurgeon in [place]

## 2017-04-27 NOTE — Telephone Encounter (Signed)
Appointment made and patient aware.

## 2019-01-13 IMAGING — CR DG SHOULDER 2+V*R*
3 series · 3 of 3 positions shown · non-contrast
Comparison: Chest x-ray of November 14, 2014 which included portions of
the right shoulder.

CLINICAL DATA: Onset of right shoulder pain today. No known injury.
History of bilateral shoulder arthritis.

EXAM:
RIGHT SHOULDER - 2+ VIEW

[w shoulder grashey right]
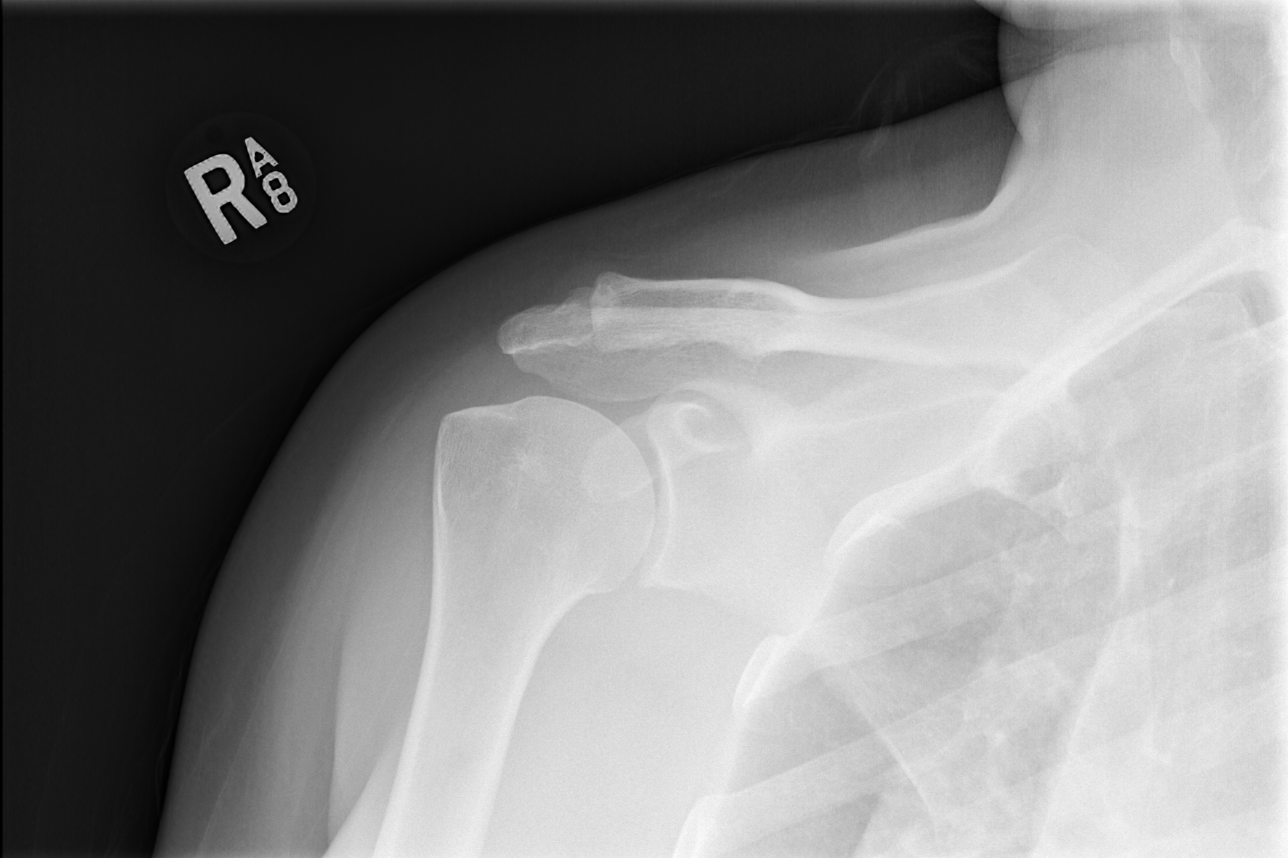

[w shoulder y view right]
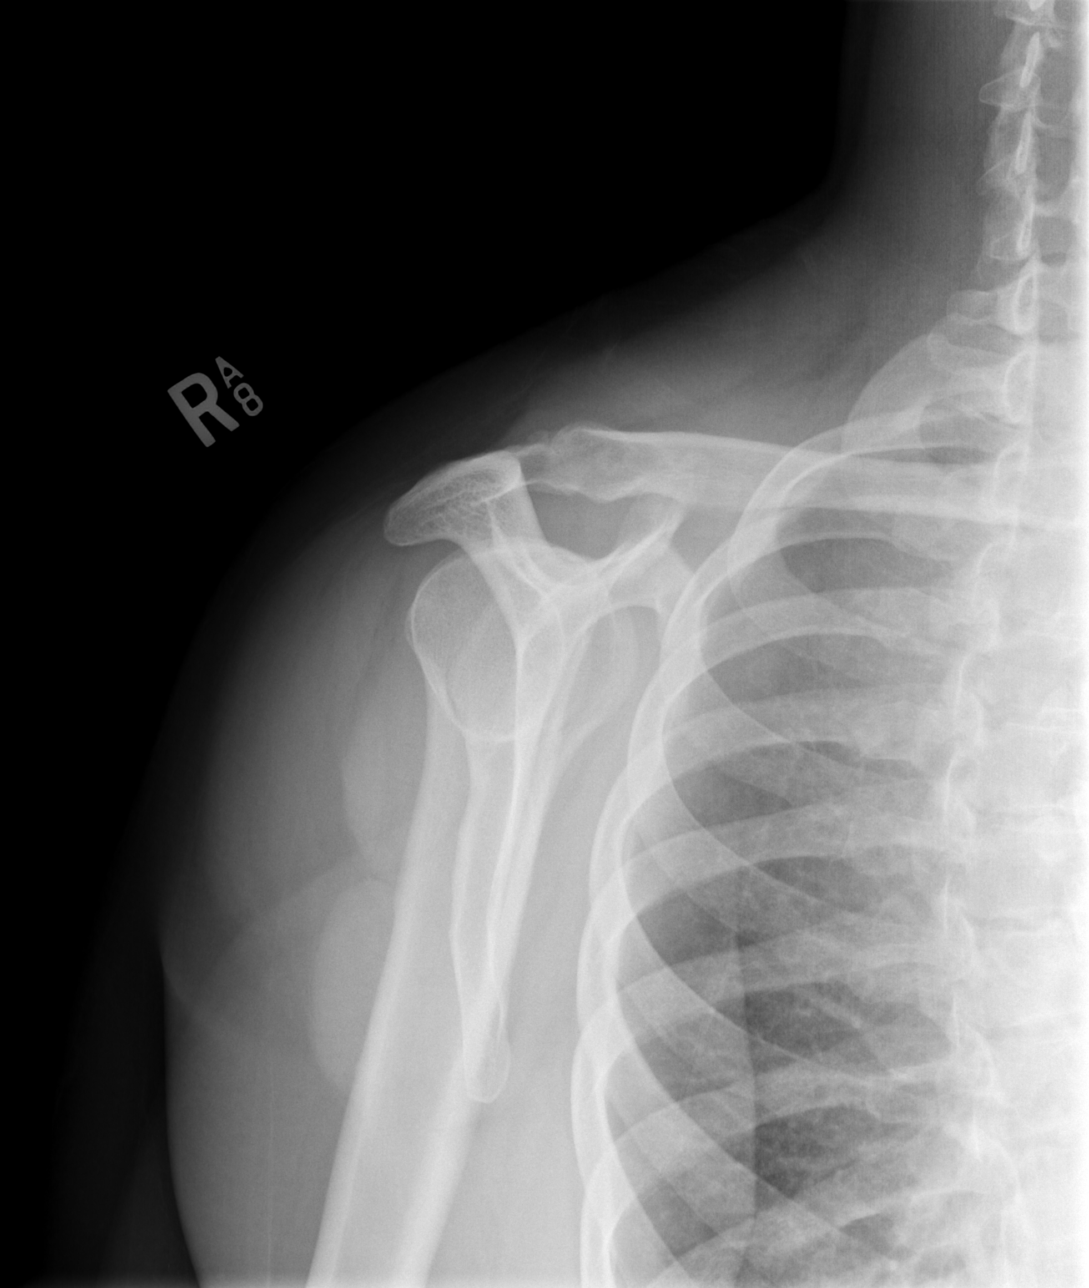

[x shoulder axillary right]
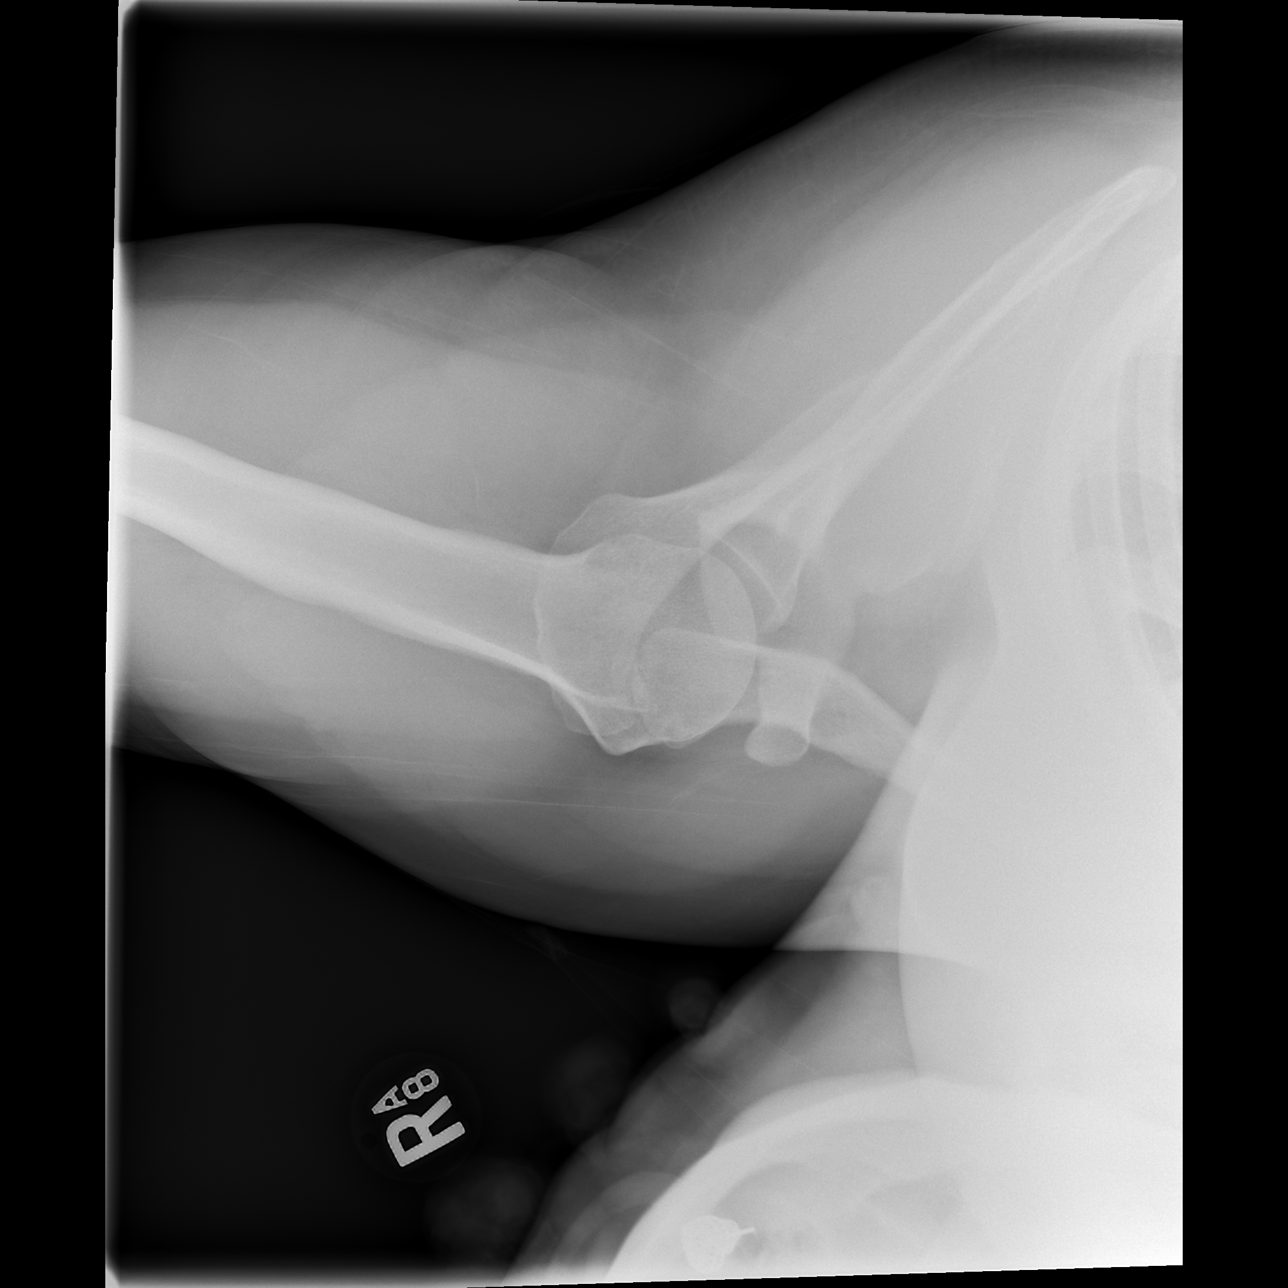

[3 of 3 positions shown; findings below may reference images not displayed]

FINDINGS: The bones are subjectively adequately mineralized. There is mild
narrowing of the glenohumeral joint space and of the AC joint space.
The subacromial subdeltoid space appears normal. There is no acute
or healing fracture and no dislocation.
IMPRESSION: There is no acute bony abnormality of the right shoulder. There are
mild degenerative changes of the AC joint and glenohumeral joints.
